# Patient Record
Sex: Male | Born: 1935 | Race: White | Hispanic: No | Marital: Married | Smoking: Former smoker
Health system: Southern US, Community
[De-identification: ages and names within clinical notes are randomized; demographics above are authoritative.]

## PROBLEM LIST (undated history)

## (undated) DIAGNOSIS — I4510 Unspecified right bundle-branch block: Secondary | ICD-10-CM

## (undated) DIAGNOSIS — I878 Other specified disorders of veins: Secondary | ICD-10-CM

## (undated) DIAGNOSIS — I10 Essential (primary) hypertension: Secondary | ICD-10-CM

## (undated) DIAGNOSIS — I4891 Unspecified atrial fibrillation: Secondary | ICD-10-CM

## (undated) DIAGNOSIS — E119 Type 2 diabetes mellitus without complications: Secondary | ICD-10-CM

## (undated) DIAGNOSIS — Z950 Presence of cardiac pacemaker: Secondary | ICD-10-CM

## (undated) DIAGNOSIS — N183 Chronic kidney disease, stage 3 unspecified: Secondary | ICD-10-CM

## (undated) DIAGNOSIS — S065XAA Traumatic subdural hemorrhage with loss of consciousness status unknown, initial encounter: Secondary | ICD-10-CM

## (undated) DIAGNOSIS — I5030 Unspecified diastolic (congestive) heart failure: Secondary | ICD-10-CM

## (undated) DIAGNOSIS — I2510 Atherosclerotic heart disease of native coronary artery without angina pectoris: Secondary | ICD-10-CM

## (undated) DIAGNOSIS — S065X9A Traumatic subdural hemorrhage with loss of consciousness of unspecified duration, initial encounter: Secondary | ICD-10-CM

## (undated) HISTORY — DX: Essential (primary) hypertension: I10

## (undated) HISTORY — DX: Atherosclerotic heart disease of native coronary artery without angina pectoris: I25.10

## (undated) HISTORY — DX: Type 2 diabetes mellitus without complications: E11.9

## (undated) HISTORY — DX: Unspecified right bundle-branch block: I45.10

## (undated) HISTORY — PX: REPLACEMENT TOTAL KNEE: SUR1224

## (undated) HISTORY — DX: Traumatic subdural hemorrhage with loss of consciousness status unknown, initial encounter: S06.5XAA

## (undated) HISTORY — DX: Chronic kidney disease, stage 3 unspecified: N18.30

## (undated) HISTORY — DX: Traumatic subdural hemorrhage with loss of consciousness of unspecified duration, initial encounter: S06.5X9A

## (undated) HISTORY — DX: Other specified disorders of veins: I87.8

## (undated) HISTORY — DX: Unspecified diastolic (congestive) heart failure: I50.30

## (undated) HISTORY — DX: Presence of cardiac pacemaker: Z95.0

## (undated) HISTORY — DX: Unspecified atrial fibrillation: I48.91

## (undated) HISTORY — PX: INTRAOCULAR LENS INSERTION: SHX110

## (undated) HISTORY — DX: Chronic kidney disease, stage 3 (moderate): N18.3

---

## 2004-07-25 HISTORY — PX: CORONARY ARTERY BYPASS GRAFT: SHX141

## 2008-07-25 HISTORY — PX: OTHER SURGICAL HISTORY: SHX169

## 2014-07-25 HISTORY — PX: INSERT / REPLACE / REMOVE PACEMAKER: SUR710

## 2018-02-27 ENCOUNTER — Other Ambulatory Visit: Payer: Self-pay | Admitting: Emergency Medicine

## 2018-02-27 DIAGNOSIS — S065X0A Traumatic subdural hemorrhage without loss of consciousness, initial encounter: Secondary | ICD-10-CM

## 2018-03-09 ENCOUNTER — Other Ambulatory Visit: Payer: Self-pay | Admitting: Emergency Medicine

## 2018-03-09 DIAGNOSIS — R918 Other nonspecific abnormal finding of lung field: Secondary | ICD-10-CM

## 2018-03-13 ENCOUNTER — Ambulatory Visit
Admission: RE | Admit: 2018-03-13 | Discharge: 2018-03-13 | Disposition: A | Discharge status: Home / Self Care | Payer: Medicare Other | Source: Ambulatory Visit | Attending: Emergency Medicine | Admitting: Emergency Medicine

## 2018-03-13 DIAGNOSIS — I517 Cardiomegaly: Secondary | ICD-10-CM | POA: Diagnosis not present

## 2018-03-13 DIAGNOSIS — I700 Atherosclerosis of aorta: Secondary | ICD-10-CM | POA: Diagnosis not present

## 2018-03-13 DIAGNOSIS — I288 Other diseases of pulmonary vessels: Secondary | ICD-10-CM | POA: Insufficient documentation

## 2018-03-13 DIAGNOSIS — J90 Pleural effusion, not elsewhere classified: Secondary | ICD-10-CM | POA: Insufficient documentation

## 2018-03-13 DIAGNOSIS — S065X0A Traumatic subdural hemorrhage without loss of consciousness, initial encounter: Secondary | ICD-10-CM

## 2018-03-13 DIAGNOSIS — X58XXXD Exposure to other specified factors, subsequent encounter: Secondary | ICD-10-CM | POA: Insufficient documentation

## 2018-03-13 DIAGNOSIS — S065X0D Traumatic subdural hemorrhage without loss of consciousness, subsequent encounter: Secondary | ICD-10-CM | POA: Diagnosis not present

## 2018-03-13 DIAGNOSIS — K7460 Unspecified cirrhosis of liver: Secondary | ICD-10-CM | POA: Insufficient documentation

## 2018-03-13 DIAGNOSIS — R918 Other nonspecific abnormal finding of lung field: Secondary | ICD-10-CM

## 2018-03-13 DIAGNOSIS — J929 Pleural plaque without asbestos: Secondary | ICD-10-CM | POA: Diagnosis not present

## 2018-03-13 DIAGNOSIS — R188 Other ascites: Secondary | ICD-10-CM | POA: Diagnosis not present

## 2018-03-13 DIAGNOSIS — R161 Splenomegaly, not elsewhere classified: Secondary | ICD-10-CM | POA: Diagnosis not present

## 2018-03-29 ENCOUNTER — Ambulatory Visit (INDEPENDENT_AMBULATORY_CARE_PROVIDER_SITE_OTHER): Payer: Medicare Other | Admitting: Internal Medicine

## 2018-03-29 ENCOUNTER — Encounter: Payer: Self-pay | Admitting: Internal Medicine

## 2018-03-29 VITALS — BP 124/54 | HR 63 | Ht 70.0 in | Wt 200.8 lb

## 2018-03-29 DIAGNOSIS — I480 Paroxysmal atrial fibrillation: Secondary | ICD-10-CM | POA: Diagnosis not present

## 2018-03-29 DIAGNOSIS — I4510 Unspecified right bundle-branch block: Secondary | ICD-10-CM | POA: Diagnosis not present

## 2018-03-29 NOTE — Patient Instructions (Signed)
Medication Instructions:  Your physician has recommended you make the following change in your medication:   1. Stop Pravastatin and Diltiazem  Labwork: You will have labs drawn today: CBC and BMP  Testing/Procedures: None ordered.  Follow-Up: Your physician recommends that you schedule a follow-up appointment in:   3 Months with Dr Graciela Husbands  Any Other Special Instructions Will Be Listed Below (If Applicable).     If you need a refill on your cardiac medications before your next appointment, please call your pharmacy.

## 2018-03-29 NOTE — Progress Notes (Signed)
ELECTROPHYSIOLOGY CONSULT NOTE  Patient ID: Steven Hogan, MRN: [id number], DOB/AGE: 1923/05/26 84 y.o. Admit date: (Not on file) Date of Consult: 03/29/2018  Primary Physician: Almetta Lovely, [facility] Primary Cardiologist: [place] Aqeel Norgaard is a 84 y.o. male who is being seen today for the evaluation of pacemaker function and heart failure at the request of Dr Making Housecall.    HPI Jerrett Baldinger is a 84 y.o. male referred because of recent development of congestive heart failure treated by doctors making house calls with the introduction of diuretics with significant interval resolution.  He has a history of coronary artery disease.  He underwent bypass surgery in 2006.  He has had no recurrent ischemia or chest pain.  He developed congestive heart failure over the summer following moving from [place].  He was treated with the introduction of diuretics with rapid improvement in symptoms.  He has remained on diuretics with potassium supplementation.  He denies orthopnea nocturnal dyspnea he has some peripheral edema which is brawny and chronic  He has a history of a fall that was traumatic but no syncope following pacemaker implantation 2016.  At that time amiodarone had been initiated in the context of right bundle branch block and first-degree AV block.    Fall resulted in a subdural hematoma 2019 He also has a history of significant renal insufficiency which is apparently improved  DATE TEST EF   8/18 Echo   55 %   8/19 Echo   "same" %           Past Medical History:  Diagnosis Date  . Coronary artery disease    s/p CABG 2006   . Heart failure with preserved ejection fraction, class III (HCC)   . Pacemaker   . RBBB   . Subdural hematoma, post-traumatic [facility])       Surgical History:  Past Surgical History:  Procedure Laterality Date  . CORONARY ARTERY BYPASS GRAFT       Home Meds: Prior to Admission medications   Medication Sig Start Date End Date  Taking? Authorizing Provider  amLODipine (NORVASC) 5 MG tablet Take 5 mg by mouth daily.   Yes [provider]  diltiazem (CARDIZEM) 30 MG tablet Take 30 mg by mouth 2 (two) times daily.   Yes [provider]  furosemide (LASIX) 40 MG tablet Take 40 mg by mouth daily.   Yes [provider]  potassium chloride (K-DUR) 10 MEQ tablet Take 10 mEq by mouth 2 (two) times daily.   Yes [provider]  pravastatin (PRAVACHOL) 40 MG tablet Take 40 mg by mouth daily.   Yes [provider]    Allergies: No Known Allergies  Social History   Socioeconomic History  . Marital status: Widowed    Spouse name: Not on file  . Number of children: Not on file  . Years of education: Not on file  . Highest education level: Not on file  Occupational History  . Not on file  Social Needs  . Financial resource strain: Not on file  . Food insecurity:    Worry: Not on file    Inability: Not on file  . Transportation needs:    Medical: Not on file    Non-medical: Not on file  Tobacco Use  . Smoking status: Not on file  Substance and Sexual Activity  . Alcohol use: Not on file  . Drug use: Not on file  . Sexual activity: Not on file  Lifestyle  .  Physical activity:    Days per week: Not on file    Minutes per session: Not on file  . Stress: Not on file  Relationships  . Social connections:    Talks on phone: Not on file    Gets together: Not on file    Attends religious service: Not on file    Active member of club or organization: Not on file    Attends meetings of clubs or organizations: Not on file    Relationship status: Not on file  . Intimate partner violence:    Fear of current or ex partner: Not on file    Emotionally abused: Not on file    Physically abused: Not on file    Forced sexual activity: Not on file  Other Topics Concern  . Not on file  Social History Narrative  . Not on file     History reviewed. No pertinent family history.     ROS:  Please see the history of present illness.     All other systems reviewed and negative.    Physical Exam  Blood pressure (!) 124/54, pulse 63, height 5\' 10"  (1.778 m), weight 200 lb 12 oz (91.1 kg), SpO2 95 %. General: Well developed, well nourished male in no acute distress. Head: Normocephalic, atraumatic, sclera non-icteric, no xanthomas, nares are without discharge. EENT: normal  Lymph Nodes:  none Neck: Negative for carotid bruits. JVD 6-7 Back:without scoliosis kyphosis Lungs: Clear bilaterally to auscultation without wheezes, rales, or rhonchi. Breathing is unlabored. Heart: RRR with S1 S2.  2/6 systolic murmur . No rubs, or gallops appreciated. Abdomen: Soft, non-tender, non-distended with normoactive bowel sounds. No hepatomegaly. No rebound/guarding. No obvious abdominal masses. Msk:  Strength and tone appear normal for age. Extremities: No clubbing or cyanosis.  1+ brawny edema.  Distal pedal pulses are 2+ and equal bilaterally. Skin: Warm and Dry Neuro: Alert and oriented X 3. CN III-XII intact Grossly normal sensory and motor function . Psych:  Responds to questions appropriately with a normal affect.      Labs: Cardiac Enzymes No results for input(s): CKTOTAL, CKMB, TROPONINI in the last 72 hours. CBC No results found for: WBC, HGB, HCT, MCV, PLT PROTIME: No results for input(s): LABPROT, INR in the last 72 hours. Chemistry No results for input(s): NA, K, CL, CO2, BUN, CREATININE, CALCIUM, PROT, BILITOT, ALKPHOS, ALT, AST, GLUCOSE in the last 168 hours.  Invalid input(s): LABALBU Lipids No results found for: CHOL, HDL, LDLCALC, TRIG BNP No results found for: PROBNP Thyroid Function Tests: No results for input(s): TSH, T4TOTAL, T3FREE, THYROIDAB in the last 72 hours.  Invalid input(s): FREET3 Miscellaneous No results found for: DDIMER  Radiology/Studies:  Ct Head Wo Contrast  Result Date: 03/14/2018 CLINICAL DATA:  Traumatic subdural hemorrhage  without loss of consciousness initial encounter. Head injury. EXAM: CT HEAD WITHOUT CONTRAST TECHNIQUE: Contiguous axial images were obtained from the base of the skull through the vertex without intravenous contrast. COMPARISON:  None. FINDINGS: Brain: Generalized atrophy. Chronic periventricular white matter disease. Negative for acute infarct, hemorrhage, or mass Vascular: Arterial calcification. Negative for acute vascular thrombosis Skull: Negative Sinuses/Orbits: Mild mucosal edema in the right sphenoid sinus. Bilateral cataract surgery Other: None IMPRESSION: No acute abnormality. Atrophy and chronic microvascular ischemia in the white matter. Electronically Signed   By: Marlan Palau M.D.   On: 03/14/2018 08:14   Ct Chest Wo Contrast  Result Date: 03/14/2018 CLINICAL DATA:  Dyspnea. EXAM: CT CHEST WITHOUT CONTRAST TECHNIQUE: Multidetector CT  imaging of the chest was performed following the standard protocol without IV contrast. COMPARISON:  None. FINDINGS: Cardiovascular: Mild cardiomegaly. No significant pericardial effusion/thickening. Left main and 3 vessel coronary atherosclerosis status post CABG. 2 lead left subclavian pacemaker is noted with lead tips in the right atrium and right ventricular apex. Atherosclerotic thoracic aorta with ectatic 4.2 cm ascending thoracic aorta. Dilated main pulmonary artery (4.1 cm diameter). Mediastinum/Nodes: No discrete thyroid nodules. Unremarkable esophagus. No pathologically enlarged axillary, mediastinal or hilar lymph nodes, noting limited sensitivity for the detection of hilar adenopathy on this noncontrast study. Lungs/Pleura: No pneumothorax. Scattered calcified pleural plaques bilaterally. Small to moderate left and small right dependent pleural effusions. Moderate compressive atelectasis in the dependent lower lobes bilaterally. No lung masses or significant pulmonary nodules in the aerated portions of the lungs. No significant bronchiectasis. Upper  abdomen: Diffusely irregular liver surface with relative hypertrophy of the lateral segment left liver lobe, compatible with hepatic cirrhosis. Small volume upper abdominal ascites. Mild splenomegaly, partially visualized. Musculoskeletal: No aggressive appearing focal osseous lesions. Intact sternotomy wires. Moderate thoracic spondylosis. IMPRESSION: 1. Bilateral calcified pleural plaques. Small to moderate left and small right dependent pleural effusions. 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 2. Moderate compressive atelectasis in the dependent lower lobes bilaterally. 3. Mild cardiomegaly. Dilated main pulmonary artery, suggesting pulmonary arterial hypertension. 4. Morphologic changes of cirrhosis. Small volume upper abdominal ascites. Splenomegaly. 5. Ectatic 4.2 cm ascending thoracic aorta. Recommend annual imaging followup by CTA or MRA. This recommendation follows 2010 ACCF/AHA/AATS/ACR/ASA/SCA/SCAI/SIR/STS/SVM Guidelines for the Diagnosis and Management of Patients with Thoracic Aortic Disease. Circulation. 2010; 121: U837-G902. Aortic Atherosclerosis (ICD10-I70.0). Electronically Signed   By: Delbert Phenix M.D.   On: 03/14/2018 08:17    EKG: ECG with intermittent atrial pacing \\Right  bundle branch block First-degree AV block   Assessment and Plan:  Syncope  Pacemaker-Medtronic  Coronary artery disease with bypass surgery  Heart failure-diastolic-chronic  Chronic kidney disease  The patient has now largely resolved congestive heart failure manifested previously by significant volume overload with abdominal distention peripheral edema and dyspnea.  He was started on diuretics by [employer]   follow-up labs have not yet been done.  He is euvolemic.  Without symptoms of ischemia  No intercurrent atrial fibrillation or flutter  The patient has been taken off of blood thinners following a traumatic subdural hematoma  We reviewed the data that we  resumption of anticoagulation is recommended following subdural hematoma.  This would be preferred to aspirin.  However, in the absence of significant interval atrial fibrillation we will hold off on that for right now.  We will recheck renal function today and change his diuretics to every other day  At 89, we will discontinue his statins therapy  He is on duplicate calcium channel blocker therapy; we will discontinue Cardizem  Device was interrogated and demonstrated normal function       Sherryl Manges

## 2018-03-30 LAB — CBC
Hematocrit: 34 % — ABNORMAL LOW (ref 37.5–51.0)
Hemoglobin: 11.2 g/dL — ABNORMAL LOW (ref 13.0–17.7)
MCH: 30.2 pg (ref 26.6–33.0)
MCHC: 32.9 g/dL (ref 31.5–35.7)
MCV: 92 fL (ref 79–97)
PLATELETS: 157 10*3/uL (ref 150–450)
RBC: 3.71 x10E6/uL — AB (ref 4.14–5.80)
RDW: 13.2 % (ref 12.3–15.4)
WBC: 4.8 10*3/uL (ref 3.4–10.8)

## 2018-03-30 LAB — BASIC METABOLIC PANEL
BUN/Creatinine Ratio: 24 (ref 10–24)
BUN: 46 mg/dL — ABNORMAL HIGH (ref 10–36)
CO2: 22 mmol/L (ref 20–29)
CREATININE: 1.92 mg/dL — AB (ref 0.76–1.27)
Calcium: 9.4 mg/dL (ref 8.6–10.2)
Chloride: 102 mmol/L (ref 96–106)
GFR calc Af Amer: 34 mL/min/{1.73_m2} — ABNORMAL LOW (ref >59)
GFR calc non Af Amer: 29 mL/min/{1.73_m2} — ABNORMAL LOW (ref >59)
Glucose: 128 mg/dL — ABNORMAL HIGH (ref 65–99)
Potassium: 4.6 mmol/L (ref 3.5–5.2)
SODIUM: 140 mmol/L (ref 134–144)

## 2018-04-17 ENCOUNTER — Ambulatory Visit: Payer: Self-pay | Admitting: Internal Medicine

## 2018-06-28 ENCOUNTER — Encounter: Payer: Self-pay | Admitting: Internal Medicine

## 2018-06-28 ENCOUNTER — Ambulatory Visit (INDEPENDENT_AMBULATORY_CARE_PROVIDER_SITE_OTHER): Payer: Medicare Other | Admitting: Internal Medicine

## 2018-06-28 VITALS — BP 120/74 | HR 74 | Ht 73.0 in | Wt 210.0 lb

## 2018-06-28 DIAGNOSIS — Z950 Presence of cardiac pacemaker: Secondary | ICD-10-CM | POA: Diagnosis not present

## 2018-06-28 DIAGNOSIS — I4510 Unspecified right bundle-branch block: Secondary | ICD-10-CM | POA: Diagnosis not present

## 2018-06-28 DIAGNOSIS — I480 Paroxysmal atrial fibrillation: Secondary | ICD-10-CM | POA: Diagnosis not present

## 2018-06-28 DIAGNOSIS — R55 Syncope and collapse: Secondary | ICD-10-CM | POA: Diagnosis not present

## 2018-06-28 NOTE — Progress Notes (Signed)
Patient Care Team: [facility], [facility] as PCP - General (Geriatric Medicine)   HPI  Steven GaskinsWalter Hogan is a 84 y.o. male Seen in follow-up for pacemaker implanted elsewhere 2016 for syncope in the context of right bundle branch block.  When seen originally 9/19 he was also volume overloaded with diastolic heart failure.  There has been a subdural hematoma from fall.  His anticoagulation had been discontinued.  We reviewed the data regarding the results of evaluations for the resumption of anticoagulation and the recommendation that they be done.  However, based on device function need to had scant interval atrial fibrillation and it was elected not to do this.  At 89 his statins were discontinued.  History of coronary artery disease with prior bypass surgery 2006.  The patient denies chest pain, shortness of breath, nocturnal dyspnea, orthopnea or peripheral edema.  There have been no palpitations, lightheadedness or syncope.     DATE TEST EF   8/18 Echo   55 %   8/19 Echo   "same" %            Records and Results Reviewed   Past Medical History:  Diagnosis Date  . A-fib (HCC)   . CKD (chronic kidney disease), stage III (HCC)   . Coronary artery disease    s/p CABG 2006   . Diabetes mellitus without complication (HCC)   . Heart failure with preserved ejection fraction, class III (HCC)   . Hypertension   . Pacemaker   . RBBB   . RBBB (right bundle branch block)   . Subdural hematoma, post-traumatic (HCC)   . Venous stasis disease     Past Surgical History:  Procedure Laterality Date  . CORONARY ARTERY BYPASS GRAFT  2006   CABG x 2   . INSERT / REPLACE / REMOVE PACEMAKER  2016   Medtronic Dual Chamber pacemaker DX: SSS  . INTRAOCULAR LENS INSERTION     bilateral   . REPLACEMENT TOTAL KNEE     bilateral knee replacement  . right laser ablation of greater saphenous vein   2010    Current Meds  Medication Sig  . amLODipine (NORVASC) 5 MG  tablet Take 5 mg by mouth daily.  . furosemide (LASIX) 40 MG tablet Take 40 mg by mouth daily.  . potassium chloride (K-DUR) 10 MEQ tablet Take 10 mEq by mouth 2 (two) times daily.    No Known Allergies    Review of Systems negative except from HPI and PMH  Physical Exam BP 120/74 (BP Location: Right Arm, Patient Position: Sitting, Cuff Size: Normal)   Pulse 74   Ht 6\' 1"  (1.854 m)   Wt 210 lb (95.3 kg)   BMI 27.71 kg/m  Well developed and well nourished in no acute distress HENT normal E scleral and icterus clear Neck Supple JVP flat; carotids brisk and full Clear to ausculation Regular rate and rhythm, no murmurs gallops or rub Soft with active bowel sounds No clubbing cyanosis  Edema Alert and oriented, grossly normal motor and sensory function Skin Warm and Dry  ECG Apacing with intrinsic conduction 42/14/45  Assessment and  Plan   Syncope  Right bundle branch block-profound first-degree AV block  Pacemaker-Medtronic  Ischemic heart disease s/p CABG  Congestive heart failure-chronic-diastolic  Atrial fibrillation-SCAF   Without symptoms of ischemia  Euvolemic continue current meds  No syncope   Paces about 50% of the time in the atrium  Scant interval atrial fibrillation at 4  hours.  Does not qualify at this level for anticoagulation-i.e. SCAF to follow  We spent more than 50% of our >25 min visit in face to face counseling regarding the above    Current medicines are reviewed at length with the patient today .  The patient does not  have concerns regarding medicines.

## 2018-06-28 NOTE — Patient Instructions (Signed)
Medication Instructions:  - Your physician recommends that you continue on your current medications as directed. Please refer to the Current Medication list given to you today.  If you need a refill on your cardiac medications before your next appointment, please call your pharmacy.   Lab work: - none ordered  If you have labs (blood work) drawn today and your tests are completely normal, you will receive your results only by: . MyChart Message (if you have MyChart) OR . A paper copy in the mail If you have any lab test that is abnormal or we need to change your treatment, we will call you to review the results.  Testing/Procedures: - none ordered  Follow-Up: At [facility], you and your health needs are our priority.  As part of our continuing mission to provide you with exceptional heart care, we have created designated Provider Care Teams.  These Care Teams include your primary Cardiologist (physician) and Advanced Practice Providers (APPs -  Physician Assistants and Nurse Practitioners) who all work together to provide you with the care you need, when you need it. . You will need a follow up appointment in 6 months with Dr. Klein.  Please call our office 2 months in advance to schedule this appointment.    Any Other Special Instructions Will Be Listed Below (If Applicable). - N/A   

## 2018-07-09 ENCOUNTER — Emergency Department
Admission: EM | Admit: 2018-07-09 | Discharge: 2018-07-09 | Disposition: A | Discharge status: Home / Self Care | Payer: Medicare Other | Attending: Emergency Medicine | Admitting: Emergency Medicine

## 2018-07-09 ENCOUNTER — Emergency Department: Payer: Medicare Other

## 2018-07-09 ENCOUNTER — Other Ambulatory Visit: Payer: Self-pay

## 2018-07-09 DIAGNOSIS — E1122 Type 2 diabetes mellitus with diabetic chronic kidney disease: Secondary | ICD-10-CM | POA: Diagnosis not present

## 2018-07-09 DIAGNOSIS — N183 Chronic kidney disease, stage 3 (moderate): Secondary | ICD-10-CM | POA: Insufficient documentation

## 2018-07-09 DIAGNOSIS — R079 Chest pain, unspecified: Secondary | ICD-10-CM | POA: Diagnosis present

## 2018-07-09 DIAGNOSIS — Z951 Presence of aortocoronary bypass graft: Secondary | ICD-10-CM | POA: Diagnosis not present

## 2018-07-09 DIAGNOSIS — Z87891 Personal history of nicotine dependence: Secondary | ICD-10-CM | POA: Insufficient documentation

## 2018-07-09 DIAGNOSIS — I129 Hypertensive chronic kidney disease with stage 1 through stage 4 chronic kidney disease, or unspecified chronic kidney disease: Secondary | ICD-10-CM | POA: Insufficient documentation

## 2018-07-09 DIAGNOSIS — I2510 Atherosclerotic heart disease of native coronary artery without angina pectoris: Secondary | ICD-10-CM | POA: Insufficient documentation

## 2018-07-09 DIAGNOSIS — Z79899 Other long term (current) drug therapy: Secondary | ICD-10-CM | POA: Insufficient documentation

## 2018-07-09 DIAGNOSIS — Z950 Presence of cardiac pacemaker: Secondary | ICD-10-CM | POA: Diagnosis not present

## 2018-07-09 LAB — BASIC METABOLIC PANEL
Anion gap: 8 (ref 5–15)
BUN: 41 mg/dL — ABNORMAL HIGH (ref 8–23)
CO2: 21 mmol/L — AB (ref 22–32)
Calcium: 8.8 mg/dL — ABNORMAL LOW (ref 8.9–10.3)
Chloride: 107 mmol/L (ref 98–111)
Creatinine, Ser: 1.81 mg/dL — ABNORMAL HIGH (ref 0.61–1.24)
GFR calc non Af Amer: 32 mL/min — ABNORMAL LOW (ref >60)
GFR, EST AFRICAN AMERICAN: 37 mL/min — AB (ref >60)
Glucose, Bld: 146 mg/dL — ABNORMAL HIGH (ref 70–99)
Potassium: 4.4 mmol/L (ref 3.5–5.1)
Sodium: 136 mmol/L (ref 135–145)

## 2018-07-09 LAB — CBC
HCT: 34.2 % — ABNORMAL LOW (ref 39.0–52.0)
Hemoglobin: 11.6 g/dL — ABNORMAL LOW (ref 13.0–17.0)
MCH: 31 pg (ref 26.0–34.0)
MCHC: 33.9 g/dL (ref 30.0–36.0)
MCV: 91.4 fL (ref 80.0–100.0)
NRBC: 0 % (ref 0.0–0.2)
Platelets: 126 10*3/uL — ABNORMAL LOW (ref 150–400)
RBC: 3.74 MIL/uL — ABNORMAL LOW (ref 4.22–5.81)
RDW: 14.1 % (ref 11.5–15.5)
WBC: 4.8 10*3/uL (ref 4.0–10.5)

## 2018-07-09 LAB — TROPONIN I
Troponin I: <0.03 ng/mL (ref <0.03)
Troponin I: <0.03 ng/mL (ref <0.03)

## 2018-07-09 MED ORDER — ASPIRIN 81 MG PO CHEW
324.0000 mg | CHEWABLE_TABLET | Freq: Once | ORAL | Status: AC
  Administered 2018-07-09: 324 mg via ORAL
  Filled 2018-07-09: qty 4

## 2018-07-09 NOTE — ED Triage Notes (Signed)
Pt is here with family, states the pt woke with chest pain this morning. Denies N/V/SOB.

## 2018-07-09 NOTE — Discharge Instructions (Signed)

## 2018-07-09 NOTE — ED Notes (Signed)
Esign not working pt verbalized discharge instructions and has no questions at this time 

## 2018-07-09 NOTE — ED Provider Notes (Signed)
[facility] Emergency Department Provider Note  ____________________________________________  Time seen: Approximately 2:45 PM  I have reviewed the triage vital signs and the nursing notes.   HISTORY  Chief Complaint Chest Pain   HPI Steven Hogan is a 84 y.o. male a history of CAD status post CABG, atrial fibrillation not on anticoagulation, diabetes, CHF with preserved EF, sick sinus syndrome status post pacemaker who presents for evaluation of chest pain.  Patient reports the pain woke him up from his sleep at 1 AM.  He is unable to describe the pain but reports it was severe and located in the center of his chest.  The pain lasted about an hour and resolved after he took 2 Tylenol.  He denies diaphoresis, nausea, vomiting, dizziness, shortness of breath associated with the chest pain.  He denies ever having similar pain in the past.  Patient has had no further episodes of pain since that one.  This morning he told his daughter what had happened and she decided to bring him to the emergency room for evaluation.  Past Medical History:  Diagnosis Date  . A-fib (HCC)   . CKD (chronic kidney disease), stage III (HCC)   . Coronary artery disease    s/p CABG 2006   . Diabetes mellitus without complication (HCC)   . Heart failure with preserved ejection fraction, class III (HCC)   . Hypertension   . Pacemaker   . RBBB   . RBBB (right bundle branch block)   . Subdural hematoma, post-traumatic (HCC)   . Venous stasis disease     There are no active problems to display for this patient.   Past Surgical History:  Procedure Laterality Date  . CORONARY ARTERY BYPASS GRAFT  2006   CABG x 2   . INSERT / REPLACE / REMOVE PACEMAKER  2016   Medtronic Dual Chamber pacemaker DX: SSS  . INTRAOCULAR LENS INSERTION     bilateral   . REPLACEMENT TOTAL KNEE     bilateral knee replacement  . right laser ablation of greater saphenous vein   2010    Prior to  Admission medications   Medication Sig Start Date End Date Taking? Authorizing Provider  amLODipine (NORVASC) 5 MG tablet Take 5 mg by mouth daily.   Yes [provider]  furosemide (LASIX) 40 MG tablet Take 40 mg by mouth daily.   Yes [provider]  potassium chloride (K-DUR) 10 MEQ tablet Take 10 mEq by mouth 2 (two) times daily.   Yes [provider]    Allergies Patient has no known allergies.  No family history on file.  Social History Social History   Tobacco Use  . Smoking status: Former Smoker    Last attempt to quit: 08/26/1974    Years since quitting: 43.8  . Smokeless tobacco: Former Engineer, water Use Topics  . Alcohol use: Yes    Comment: occas.   . Drug use: Never    Review of Systems  Constitutional: Negative for fever. Eyes: Negative for visual changes. ENT: Negative for sore throat. Neck: No neck pain  Cardiovascular: + chest pain. Respiratory: Negative for shortness of breath. Gastrointestinal: Negative for abdominal pain, vomiting or diarrhea. Genitourinary: Negative for dysuria. Musculoskeletal: Negative for back pain. Skin: Negative for rash. Neurological: Negative for headaches, weakness or numbness. Psych: No SI or HI  ____________________________________________   PHYSICAL EXAM:  VITAL SIGNS: ED Triage Vitals  Enc Vitals Group     BP 07/09/18  1149 135/60     Pulse Rate 07/09/18 1149 61     Resp 07/09/18 1149 18     Temp 07/09/18 1149 97.6 F (36.4 C)     Temp Source 07/09/18 1149 Oral     SpO2 07/09/18 1149 97 %     Weight 07/09/18 1150 192 lb (87.1 kg)     Height 07/09/18 1150 6\' 1"  (1.854 m)     Head Circumference --      Peak Flow --      Pain Score 07/09/18 1149 0     Pain Loc --      Pain Edu? --      Excl. in GC? --     Constitutional: Alert and oriented. Well appearing and in no apparent distress. HEENT:      Head: Normocephalic and atraumatic.         Eyes: Conjunctivae are normal. Sclera  is non-icteric.       Mouth/Throat: Mucous membranes are moist.       Neck: Supple with no signs of meningismus. Cardiovascular: Regular rate and rhythm. No murmurs, gallops, or rubs. 2+ symmetrical distal pulses are present in all extremities. No JVD. Respiratory: Normal respiratory effort. Lungs are clear to auscultation bilaterally. No wheezes, crackles, or rhonchi.  Gastrointestinal: Soft, non tender, and non distended with positive bowel sounds. No rebound or guarding. Musculoskeletal: Nontender with normal range of motion in all extremities. No edema, cyanosis, or erythema of extremities. Neurologic: Normal speech and language. Face is symmetric. Moving all extremities. No gross focal neurologic deficits are appreciated. Skin: Skin is warm, dry and intact. No rash noted. Psychiatric: Mood and affect are normal. Speech and behavior are normal.  ____________________________________________   LABS (all labs ordered are listed, but only abnormal results are displayed)  Labs Reviewed  BASIC METABOLIC PANEL - Abnormal; Notable for the following components:      Result Value   CO2 21 (*)    Glucose, Bld 146 (*)    BUN 41 (*)    Creatinine, Ser 1.81 (*)    Calcium 8.8 (*)    GFR calc non Af Amer 32 (*)    GFR calc Af Amer 37 (*)    All other components within normal limits  CBC - Abnormal; Notable for the following components:   RBC 3.74 (*)    Hemoglobin 11.6 (*)    HCT 34.2 (*)    Platelets 126 (*)    All other components within normal limits  TROPONIN I  TROPONIN I   ____________________________________________  EKG  ED ECG REPORT I, Nita Sicklearolina Anderia Lorenzo, the attending physician, personally viewed and interpreted this ECG.  Normal sinus rhythm with first-degree AV block, right bundle branch block, normal QTC, normal axis, T wave inversions in anterior leads with no ST elevations.  No significant changes when compared to  prior. ____________________________________________  RADIOLOGY  I have personally reviewed the images performed during this visit and I agree with the Radiologist's read.   Interpretation by Radiologist:  Dg Chest 2 View  Result Date: 07/09/2018 CLINICAL DATA:  Chest pain for 1 day. Ex-smoker. EXAM: CHEST - 2 VIEW COMPARISON:  07/09/2018. FINDINGS: Normal sized heart. Stable left subclavian pacemaker leads and post CABG changes. Clear lungs with normal vascularity. Thoracic spine degenerative changes. IMPRESSION: No acute abnormality. Electronically Signed   By: Beckie SaltsSteven  Reid M.D.   On: 07/09/2018 15:15   Dg Chest 2 View  Result Date: 07/09/2018 CLINICAL DATA:  Chest pain EXAM: CHEST -  2 VIEW COMPARISON:  March 13, 2018. FINDINGS: There is a probable nipple shadow on the left. There is no edema or consolidation. Heart size and pulmonary vascularity are normal. No adenopathy. Pacemaker leads are attached to the right atrium and right ventricle. Patient is status post median sternotomy. There is aortic atherosclerosis. No evident bone lesions. IMPRESSION: Probable left-sided nipple shadow; advise repeat study with nipple markers to confirm. No edema or consolidation. Heart size normal. Pacemaker leads attached to right atrium and right ventricle. There is aortic atherosclerosis. Aortic Atherosclerosis (ICD10-I70.0). Electronically Signed   By: Bretta Bang III M.D.   On: 07/09/2018 12:44     ____________________________________________   PROCEDURES  Procedure(s) performed: None Procedures Critical Care performed:  None ____________________________________________   INITIAL IMPRESSION / ASSESSMENT AND PLAN / ED COURSE  84 y.o. male a history of CAD status post CABG, atrial fibrillation not on anticoagulation, diabetes, CHF with preserved EF, sick sinus syndrome status post pacemaker who presents for evaluation of chest pain.  Patient with one episode of chest pain at 1 AM that  lasted an hour.  Right now he is completely asymptomatic.  EKG showing no acute ischemic changes, unchanged from baseline.  First troponin is negative.  Second troponin is due at 3 PM.  Patient is followed by Dr. Graciela Husbands.  Differential diagnoses including ACS versus of acute spasms versus GERD.  With full resolution of the symptoms PE and dissection are less likely.  Patient has no tachypnea, tachycardia, hypoxia, or severely elevated blood pressure, no neurological deficits, normal mediastinum silhouette on chest x-ray.  Patient remains asymptomatic with negative second troponin plan is to discharge home with close follow-up with patient's cardiologist.  Daughter who is a nurse is in agreement with this plan and so is the patient.  We will interrogate pacemaker  Clinical Course as of Jul 09 1617  Mon Jul 09, 2018  1617 Second troponin negative.  Patient remains pain-free.  Discussed admission versus follow-up as an outpatient with cardiologist with patient and his daughter and they both prefer outpatient management.  Recommended close follow-up with cardiologist and return to the emergency room if chest pain recurs.   [CV]    Clinical Course User Index [CV] Don Perking Washington, MD     As part of my medical decision making, I reviewed the following data within the electronic MEDICAL RECORD NUMBER History obtained from family, Nursing notes reviewed and incorporated, Labs reviewed , EKG interpreted , Old EKG reviewed, Old chart reviewed, Radiograph reviewed , Notes from prior ED visits and [place] Controlled Substance Database    Pertinent labs & imaging results that were available during my care of the patient were reviewed by me and considered in my medical decision making (see chart for details).    ____________________________________________   FINAL CLINICAL IMPRESSION(S) / ED DIAGNOSES  Final diagnoses:  Chest pain, unspecified type      NEW MEDICATIONS STARTED DURING THIS VISIT:  ED  Discharge Orders    None       Note:  This document was prepared using Dragon voice recognition software and may include unintentional dictation errors.    Nita Sickle, MD 07/09/18 [id number]

## 2018-12-04 ENCOUNTER — Telehealth: Payer: Self-pay | Admitting: Cardiology

## 2018-12-04 NOTE — Telephone Encounter (Signed)
[address] [place] [address] Attention Diane  Ilan Kehres - Need Device info and serail number and a new monitor ordered. Address to order new monitor to.

## 2018-12-04 NOTE — Telephone Encounter (Signed)
Spoke w/ pt daughter and she would like a home monitor ordered for the patient. I informed her I would call [employer] and have a new monitor ordered. Pt daughter verbalized understanding and is aware I will call her back with an update once the monitor is ordered.

## 2018-12-04 NOTE — Telephone Encounter (Signed)
Called [employer] they provided me w/ pt model number and serial number. Will try and order a monitor through [employer] or I will call tomorrow to get a monitor ordered.   Pt enrolled in carelink. Monitor ordered in carelink.

## 2018-12-05 NOTE — Telephone Encounter (Signed)
Spoke w/ pt daughter and informed her that pt home monitor has been ordered. She should receive it in 7-10 business days. She will call once the monitor is received.

## 2018-12-28 ENCOUNTER — Emergency Department: Payer: Medicare Other

## 2018-12-28 ENCOUNTER — Encounter: Payer: Self-pay | Admitting: Emergency Medicine

## 2018-12-28 ENCOUNTER — Emergency Department
Admission: EM | Admit: 2018-12-28 | Discharge: 2018-12-28 | Disposition: A | Discharge status: Home / Self Care | Payer: Medicare Other | Attending: Emergency Medicine | Admitting: Emergency Medicine

## 2018-12-28 ENCOUNTER — Other Ambulatory Visit: Payer: Self-pay

## 2018-12-28 DIAGNOSIS — S99922A Unspecified injury of left foot, initial encounter: Secondary | ICD-10-CM | POA: Diagnosis present

## 2018-12-28 DIAGNOSIS — I130 Hypertensive heart and chronic kidney disease with heart failure and stage 1 through stage 4 chronic kidney disease, or unspecified chronic kidney disease: Secondary | ICD-10-CM | POA: Diagnosis not present

## 2018-12-28 DIAGNOSIS — I509 Heart failure, unspecified: Secondary | ICD-10-CM | POA: Diagnosis not present

## 2018-12-28 DIAGNOSIS — I2510 Atherosclerotic heart disease of native coronary artery without angina pectoris: Secondary | ICD-10-CM | POA: Insufficient documentation

## 2018-12-28 DIAGNOSIS — E1122 Type 2 diabetes mellitus with diabetic chronic kidney disease: Secondary | ICD-10-CM | POA: Insufficient documentation

## 2018-12-28 DIAGNOSIS — Z87891 Personal history of nicotine dependence: Secondary | ICD-10-CM | POA: Diagnosis not present

## 2018-12-28 DIAGNOSIS — W07XXXA Fall from chair, initial encounter: Secondary | ICD-10-CM | POA: Insufficient documentation

## 2018-12-28 DIAGNOSIS — Z96653 Presence of artificial knee joint, bilateral: Secondary | ICD-10-CM | POA: Diagnosis not present

## 2018-12-28 DIAGNOSIS — S91115A Laceration without foreign body of left lesser toe(s) without damage to nail, initial encounter: Secondary | ICD-10-CM | POA: Diagnosis not present

## 2018-12-28 DIAGNOSIS — Y999 Unspecified external cause status: Secondary | ICD-10-CM | POA: Diagnosis not present

## 2018-12-28 DIAGNOSIS — Y9384 Activity, sleeping: Secondary | ICD-10-CM | POA: Diagnosis not present

## 2018-12-28 DIAGNOSIS — N183 Chronic kidney disease, stage 3 (moderate): Secondary | ICD-10-CM | POA: Insufficient documentation

## 2018-12-28 DIAGNOSIS — Y929 Unspecified place or not applicable: Secondary | ICD-10-CM | POA: Diagnosis not present

## 2018-12-28 DIAGNOSIS — Z951 Presence of aortocoronary bypass graft: Secondary | ICD-10-CM | POA: Diagnosis not present

## 2018-12-28 DIAGNOSIS — Z950 Presence of cardiac pacemaker: Secondary | ICD-10-CM | POA: Diagnosis not present

## 2018-12-28 DIAGNOSIS — S99292A Other physeal fracture of phalanx of left toe, initial encounter for closed fracture: Secondary | ICD-10-CM | POA: Insufficient documentation

## 2018-12-28 DIAGNOSIS — Z23 Encounter for immunization: Secondary | ICD-10-CM | POA: Insufficient documentation

## 2018-12-28 DIAGNOSIS — Z79899 Other long term (current) drug therapy: Secondary | ICD-10-CM | POA: Diagnosis not present

## 2018-12-28 MED ORDER — LIDOCAINE HCL (PF) 1 % IJ SOLN
5.0000 mL | Freq: Once | INTRAMUSCULAR | Status: AC
  Administered 2018-12-28: 5 mL via INTRADERMAL
  Filled 2018-12-28: qty 5

## 2018-12-28 MED ORDER — CEPHALEXIN 500 MG PO CAPS: 500.0000 mg | ORAL_CAPSULE | Freq: Three times a day (TID) | ORAL | 0 refills | Status: AC

## 2018-12-28 MED ORDER — TETANUS-DIPHTH-ACELL PERTUSSIS 5-2.5-18.5 LF-MCG/0.5 IM SUSP
0.5000 mL | Freq: Once | INTRAMUSCULAR | Status: AC
  Administered 2018-12-28: 0.5 mL via INTRAMUSCULAR
  Filled 2018-12-28: qty 0.5

## 2018-12-28 NOTE — ED Notes (Signed)
Pt has laceration between 4th and 5th toes of left foot. Gauze placed, some bleeding noted. Pt reports mechanical fall. No obvious deformity

## 2018-12-28 NOTE — ED Provider Notes (Signed)
[facility] Emergency Department Provider Note  ____________________________________________  Time seen: Approximately 11:47 AM  I have reviewed the triage vital signs and the nursing notes.   HISTORY  Chief Complaint Fall and Laceration  Level 5 Caveat: Portions of the History and Physical including HPI and review of systems are unable to be completely obtained due to patient being a poor historian    HPI Steven Hogan is a 85 y.o. male with a history of atrial fibrillation diabetes hypertension CKD who was in his usual state of health when he slipped out of his recliner that he was sleeping and last night, sustaining an injury to the left fifth toe as he fell to the floor.  Denies any other injury.  Denies any pain.  He has had bleeding from the fifth toe.      Past Medical History:  Diagnosis Date  . A-fib (HCC)   . CKD (chronic kidney disease), stage III (HCC)   . Coronary artery disease    s/p CABG 2006   . Diabetes mellitus without complication (HCC)   . Heart failure with preserved ejection fraction, class III (HCC)   . Hypertension   . Pacemaker   . RBBB   . RBBB (right bundle branch block)   . Subdural hematoma, post-traumatic (HCC)   . Venous stasis disease      There are no active problems to display for this patient.    Past Surgical History:  Procedure Laterality Date  . CORONARY ARTERY BYPASS GRAFT  2006   CABG x 2   . INSERT / REPLACE / REMOVE PACEMAKER  2016   [employer]tronic Dual Chamber pacemaker DX: SSS  . INTRAOCULAR LENS INSERTION     bilateral   . REPLACEMENT TOTAL KNEE     bilateral knee replacement  . right laser ablation of greater saphenous vein   2010     Prior to Admission medications   Medication Sig Start Date End Date Taking? Authorizing Provider  mirabegron ER (MYRBETRIQ) 25 MG TB24 tablet Take 25 mg by mouth daily. 09/27/18  Yes [provider]  acetaminophen (TYLENOL) 500 MG tablet Take 1,000 mg by  mouth as needed.    [provider]  amLODipine (NORVASC) 5 MG tablet Take 5 mg by mouth daily.    [provider]  cephALEXin (KEFLEX) 500 MG capsule Take 1 capsule (500 mg total) by mouth 3 (three) times daily. 12/28/18   Sharman Cheek, MD  furosemide (LASIX) 40 MG tablet Take 40 mg by mouth daily.    [provider]  potassium chloride (K-DUR) 10 MEQ tablet Take 10 mEq by mouth 2 (two) times daily.    [provider]     Allergies Patient has no known allergies.   History reviewed. No pertinent family history.  Social History Social History   Tobacco Use  . Smoking status: Former Smoker    Last attempt to quit: 08/26/1974    Years since quitting: 44.3  . Smokeless tobacco: Former Engineer, water Use Topics  . Alcohol use: Yes    Comment: occas.   . Drug use: Never    Review of Systems  Constitutional:   No fever or chills.  ENT:   No sore throat. No rhinorrhea. Cardiovascular:   No chest pain or syncope. Respiratory:   No dyspnea or cough. Gastrointestinal:   Negative for abdominal pain, vomiting and diarrhea.  Musculoskeletal:   Left fifth toe injury All other systems reviewed and are negative  except as documented above in ROS and HPI.  ____________________________________________   PHYSICAL EXAM:  VITAL SIGNS: ED Triage Vitals  Enc Vitals Group     BP 12/28/18 1000 (!) 143/64     Pulse Rate 12/28/18 1000 70     Resp 12/28/18 1000 18     Temp 12/28/18 1000 97.6 F (36.4 C)     Temp Source 12/28/18 1000 Oral     SpO2 12/28/18 1000 98 %     Weight 12/28/18 1002 216 lb (98 kg)     Height 12/28/18 1002 6\' 1"  (1.854 m)     Head Circumference --      Peak Flow --      Pain Score 12/28/18 1001 0     Pain Loc --      Pain Edu? --      Excl. in GC? --     Vital signs reviewed, nursing assessments reviewed.   Constitutional:   Alert and oriented. Non-toxic appearance. Eyes:   Conjunctivae are normal. EOMI. ENT       Head:   Normocephalic and atraumatic.      Mouth/Throat:   MMM      Neck:   No meningismus. Full ROM.  No midline spinal tenderness Hematological/Lymphatic/Immunilogical:   No cervical lymphadenopathy. Cardiovascular:   RRR. Symmetric bilateral radial and DP pulses.  No murmurs. Cap refill less than 2 seconds. Respiratory:   Normal respiratory effort without tachypnea/retractions. Breath sounds are clear and equal bilaterally. No wheezes/rales/rhonchi. Gastrointestinal:   Soft and nontender. Non distended. There is no CVA tenderness.  No rebound, rigidity, or guarding. Musculoskeletal:   Some tenderness of the left fifth toe with associated laceration through the interdigital webspace. Neurologic:   Normal speech and language.  Motor grossly intact. No acute focal neurologic deficits are appreciated.  Skin:    Skin is warm, dry and intact except for laceration in the left webspace between fourth and fifth toes. No rash noted.  ____________________________________________    LABS (pertinent positives/negatives) (all labs ordered are listed, but only abnormal results are displayed) Labs Reviewed - No data to display ____________________________________________   EKG  ____________________________________________    RADIOLOGY  Dg Foot Complete Left  Result Date: 12/28/2018 CLINICAL DATA:  Fall with left fifth toe injury EXAM: LEFT FOOT - COMPLETE 3+ VIEW COMPARISON:  None. FINDINGS: Fracture at the junction of the shaft and base of the fifth proximal phalanx with lateral sided impaction. No dislocation. Gauze is present between the fourth and fifth digits, with digit widening. IMPRESSION: Impacted fracture of the fifth proximal phalanx at the junction of the base and shaft. Electronically Signed   By: Marnee SpringJonathon  Watts M.D.   On: 12/28/2018 10:50    ____________________________________________   PROCEDURES .Marland Kitchen.Laceration Repair Date/Time: 12/28/2018 11:51 AM Performed by: Sharman CheekStafford,  Acie Custis, MD Authorized by: Sharman CheekStafford, Sung Parodi, MD   Consent:    Consent obtained:  Verbal   Consent given by:  Patient   Risks discussed:  Infection, pain, retained foreign body, poor cosmetic result and poor wound healing   Alternatives discussed:  No treatment and referral Universal protocol:    Imaging studies available: yes     Patient identity confirmed:  Verbally with patient and provided demographic data Anesthesia (see MAR for exact dosages):    Anesthesia method:  Local infiltration   Local anesthetic:  Lidocaine 1% w/o epi Laceration details:    Location:  Toe   Toe location:  L little toe   Length (cm):  3 Repair type:    Repair type:  Simple Pre-procedure details:    Preparation:  Patient was prepped and draped in usual sterile fashion and imaging obtained to evaluate for foreign bodies Exploration:    Hemostasis achieved with:  Direct pressure and tourniquet   Wound exploration: wound explored through full range of motion and entire depth of wound probed and visualized     Wound extent: no foreign bodies/material noted, no muscle damage noted, no nerve damage noted, no tendon damage noted and no vascular damage noted     Contaminated: no   Treatment:    Area cleansed with:  Saline and Betadine   Amount of cleaning:  Extensive   Irrigation solution:  Sterile saline   Irrigation method:  Pressure wash   Visualized foreign bodies/material removed: no   Skin repair:    Repair method:  Sutures   Suture size:  4-0   Wound skin closure material used: ethilon.   Suture technique:  Running   Number of sutures:  4 Approximation:    Approximation:  Close Post-procedure details:    Dressing:  Sterile dressing   Patient tolerance of procedure:  Tolerated well, no immediate complications    ____________________________________________  CLINICAL IMPRESSION / ASSESSMENT AND PLAN / ED COURSE  Pertinent labs & imaging results that were available during my care of the  patient were reviewed by me and considered in my medical decision making (see chart for details).  Steven Hogan was evaluated in Emergency Department on 12/28/2018 for the symptoms described in the history of present illness. He was evaluated in the context of the global COVID-19 pandemic, which necessitated consideration that the patient might be at risk for infection with the SARS-CoV-2 virus that causes COVID-19. Institutional protocols and algorithms that pertain to the evaluation of patients at risk for COVID-19 are in a state of rapid change based on information released by regulatory bodies including the CDC and federal and state organizations. These policies and algorithms were followed during the patient's care in the ED.   Patient presents after left foot trauma while sliding out of his recliner at night.  He appears to have some degree of dementia but reports that he is moving to [place] with his daughter today to go to an independent living facility with medical oversight.  X-ray shows fracture of the proximal phalanx of the left fifth toe.  The laceration is not overlying this fracture and appears to be related to skin stress and not an actual open fracture.  However, we will plan to have him take Keflex prophylaxis.  Tetanus was updated.  Wound care provided, cleansed with iodine, irrigated copiously.  Wound repaired with sutures and hemostatic and dressed.  Recommended that he follow-up with podiatry in 3 days for close follow-up and assessment of his healing.  Sutures will need to be removed in 7 to 10 days.  All of this has been provided to him in written instructions.      ____________________________________________   FINAL CLINICAL IMPRESSION(S) / ED DIAGNOSES    Final diagnoses:  Other physeal fracture of phalanx of left toe, initial encounter for closed fracture     ED Discharge Orders         Ordered    cephALEXin (KEFLEX) 500 MG capsule  3 times daily      12/28/18 1147          Portions of this note were generated with dragon dictation software. Dictation errors may occur despite best  attempts at proofreading.   Sharman Cheek, MD 12/28/18 1153

## 2018-12-28 NOTE — Discharge Instructions (Signed)
You were seen today for a fall which resulted in a fracture of the left 5th toe (proximal phalanx)  with an associated laceration in the webspace between the 4th and 5th toes.  Due to the risk of infection, you should take keflex for the next week while this heals.  You should follow up with podiatry in 3 days for continued monitoring of the wound healing.    The laceration was repaired with 4 sutures in a running fashion. This should be removed in 7-10 days.  Seek medical attention immediately if you start having redness, warmth, swelling, or pain in the toe or foot, or if you develop fever or fluid drainage from the wound.  We gave you a tetanus vaccine today.

## 2018-12-28 NOTE — ED Notes (Signed)
Pt upset and raising voice upset because he states " I have been here over 2 hours what is going on".  Explained to patient he has been here about 1 hour and that there are many patients in the ER and the doctor is coming to stitch his foot as soon as he can. Patient unhappy and stating "maybe I should just leave and see my doctor". Informed patient everyone doing the best can but cannot keep him if he wants to go.  Pt updated that just waiting for MD to suture foot but he was waiting on xray results before suturing.

## 2018-12-28 NOTE — ED Triage Notes (Addendum)
Pt arrives via [facility] from [facility], pt appeared to have slid attempting to get into the bed, pt hit his foot and has a lac between the left 4th and 5th toe. No loc. fsbs 196, bp 175/75, pulsed 70, 96% RA, temp 97.2 oral

## 2019-02-14 ENCOUNTER — Telehealth: Payer: Self-pay

## 2019-02-14 NOTE — Telephone Encounter (Signed)
LMOVM for pt daughter to return call.  

## 2019-02-14 NOTE — Telephone Encounter (Signed)
Pt daughter states that the pt moved to [place] and will be seeing a Cardiologist there.

## 2019-02-14 NOTE — Telephone Encounter (Signed)
Moved to [facility] and will being seeing cardiologist there.

## 2019-02-15 NOTE — Telephone Encounter (Signed)
Noted  

## 2020-06-10 IMAGING — CT CT CHEST W/O CM
2 of 4 series · 15 of 36 positions shown, 18 images · non-contrast
Comparison: None.

CLINICAL DATA: Dyspnea.

EXAM:
CT CHEST WITHOUT CONTRAST
TECHNIQUE: Multidetector CT imaging of the chest was performed following the
standard protocol without IV contrast.

[Series 2: chest · axial · 0.68mm/px · z∈[-907,-639]mm · 12 of 160 slices shown, 15 images (1 of 2)]
[im 13/160  mediastinal]
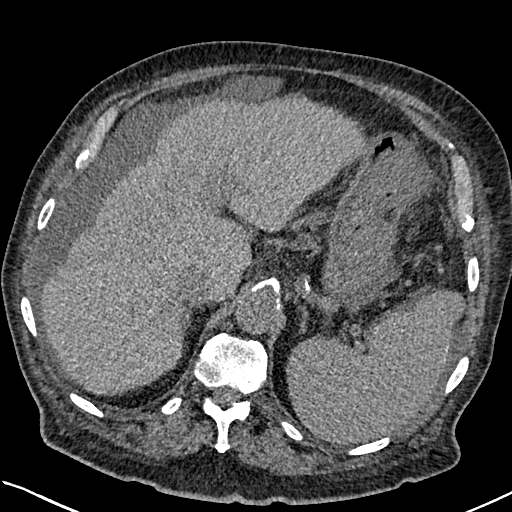
[im 13/160  lung]
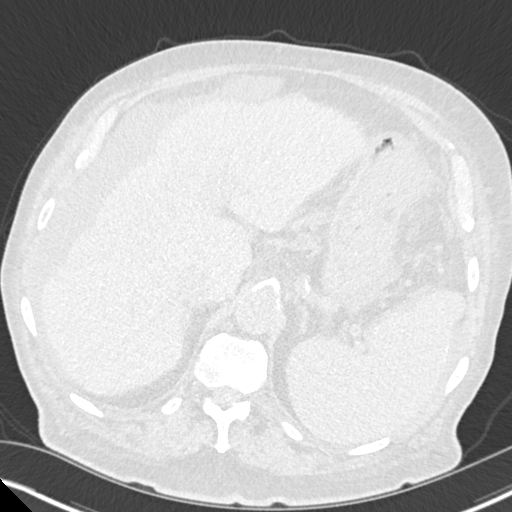
[im 25/160  lung]
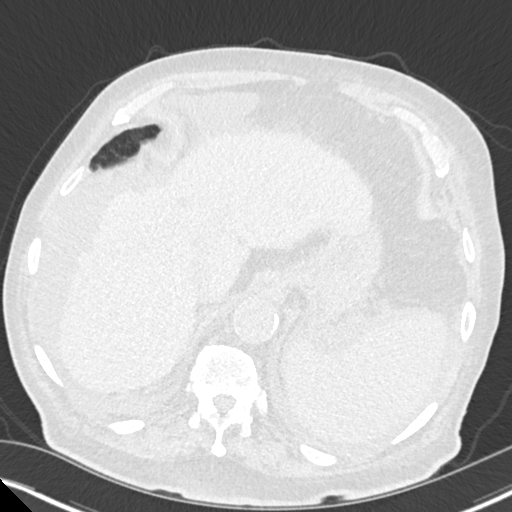
[im 37/160  lung]
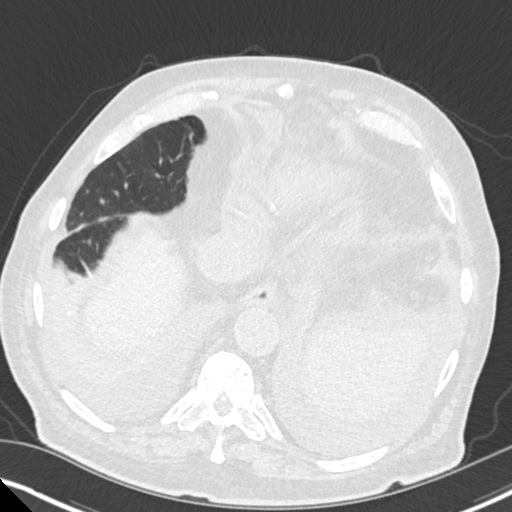
[im 49/160  lung]
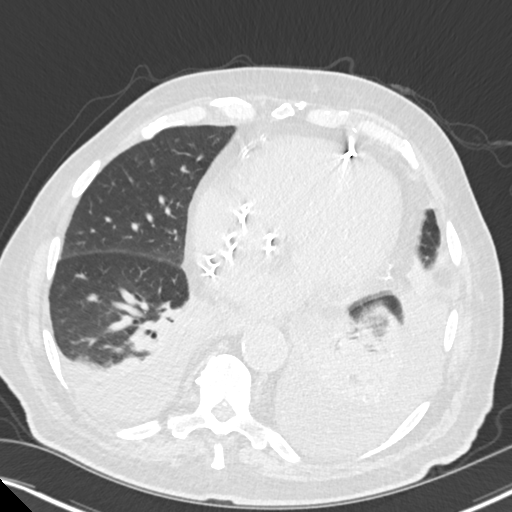
[im 62/160  mediastinal]
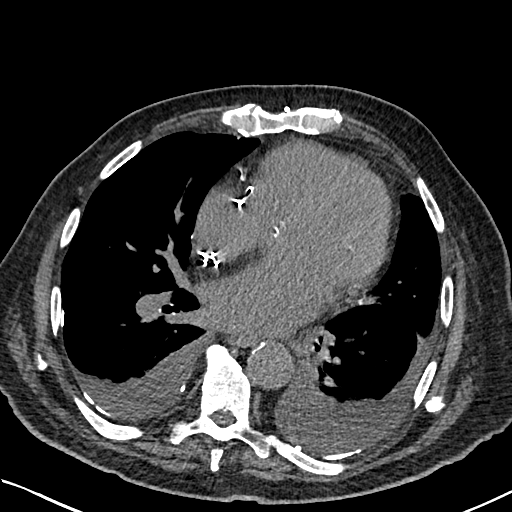
[im 62/160  lung]
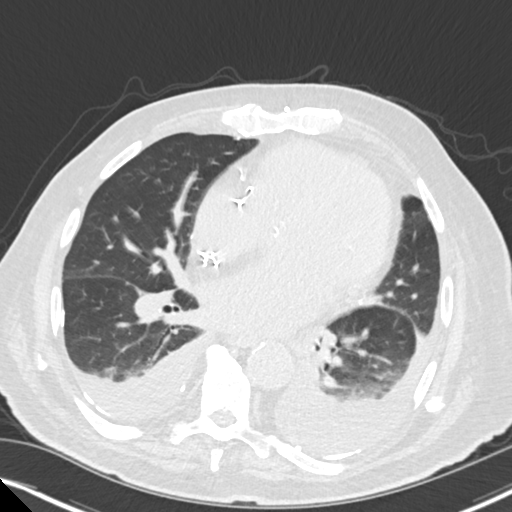
[im 74/160  lung]
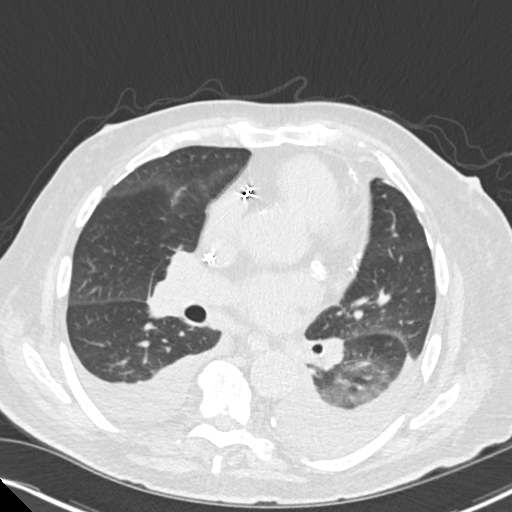
[im 86/160  lung]
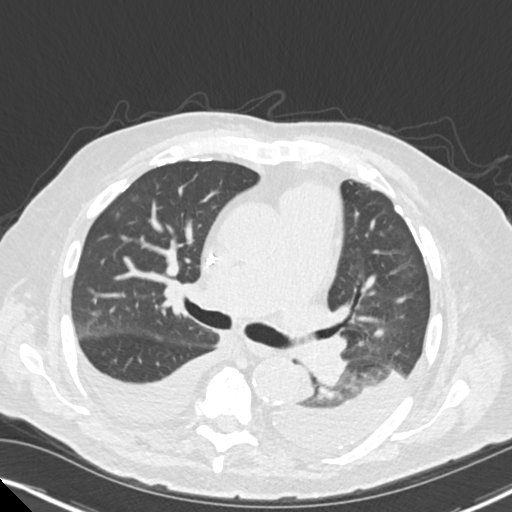
[im 98/160  lung]
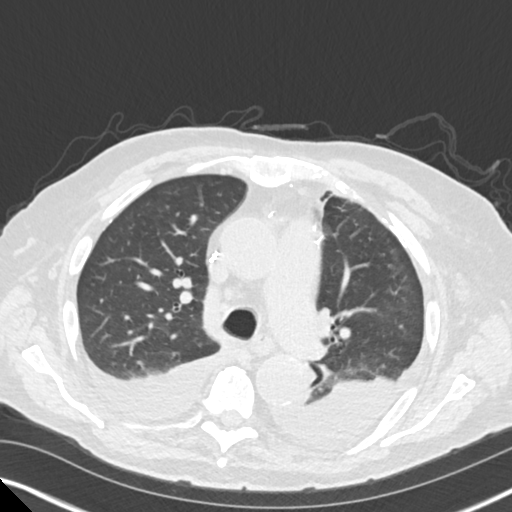
[im 111/160  mediastinal]
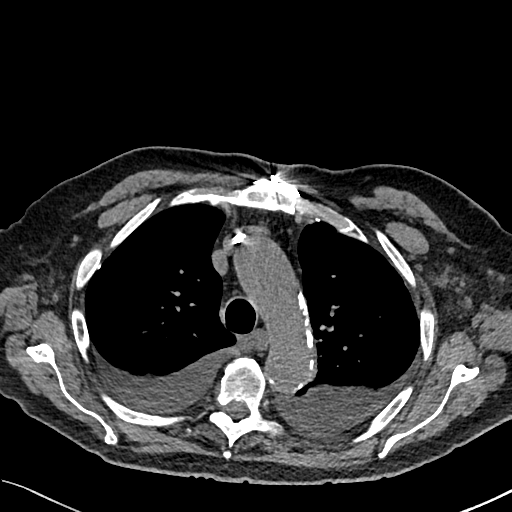
[im 111/160  lung]
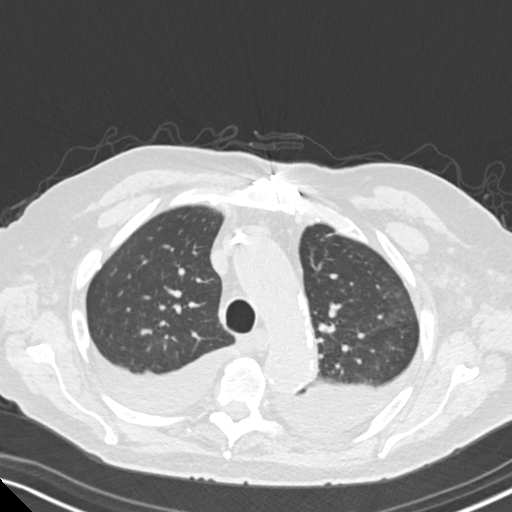
[im 123/160  lung]
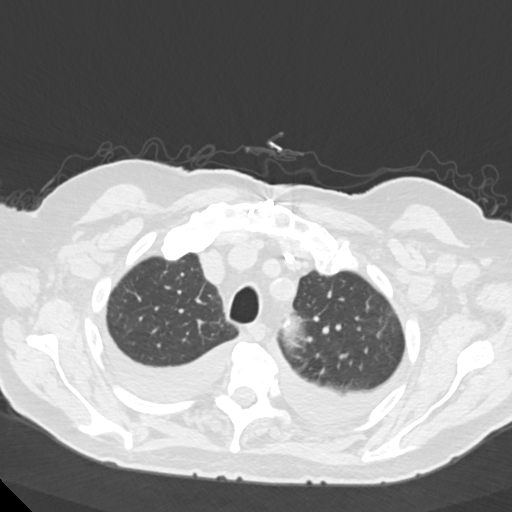
[im 135/160  lung]
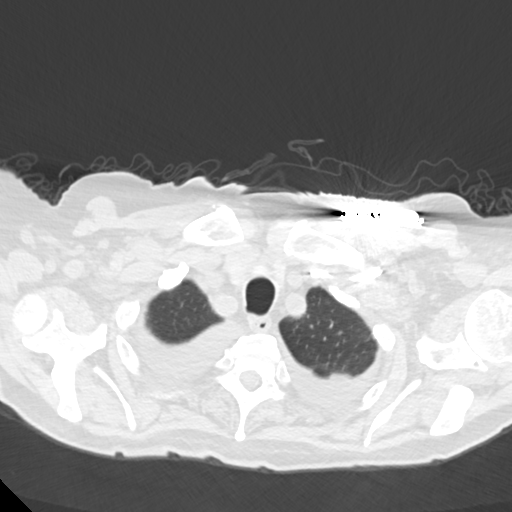
[im 147/160  lung]
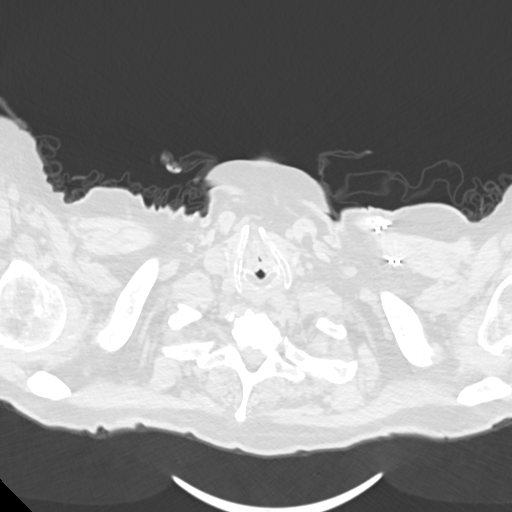

[Series 5: chest · coronal · 0.63mm/px · 3 of 174 slices shown (2 of 2)]
[im 35/174  lung]
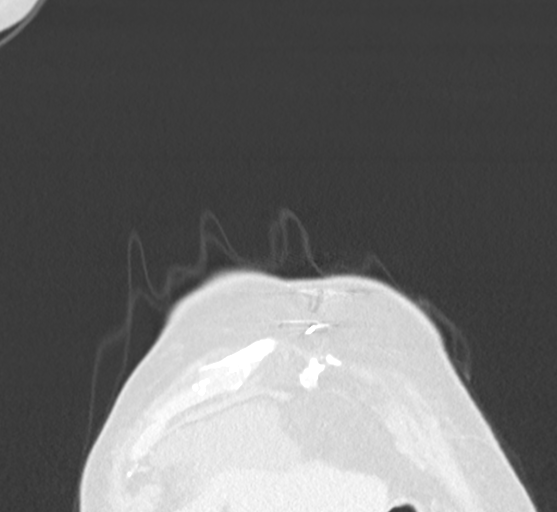
[im 70/174  lung]
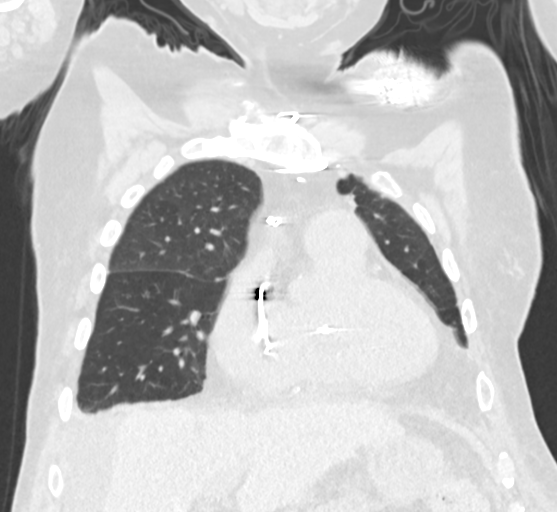
[im 104/174  lung]
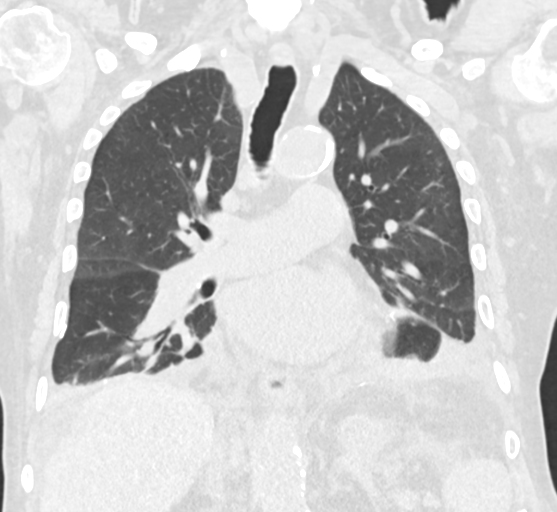

[15 of 36 positions shown; findings below may reference images not displayed]

FINDINGS: Cardiovascular: Mild cardiomegaly. No significant pericardial
effusion/thickening. Left main and 3 vessel coronary atherosclerosis
status post CABG. 2 lead left subclavian pacemaker is noted with
lead tips in the right atrium and right ventricular apex.
Atherosclerotic thoracic aorta with ectatic 4.2 cm ascending
thoracic aorta. Dilated main pulmonary artery (4.1 cm diameter).

Mediastinum/Nodes: No discrete thyroid nodules. Unremarkable
esophagus. No pathologically enlarged axillary, mediastinal or hilar
lymph nodes, noting limited sensitivity for the detection of hilar
adenopathy on this noncontrast study.

Lungs/Pleura: No pneumothorax. Scattered calcified pleural plaques
bilaterally. Small to moderate left and small right dependent
pleural effusions. Moderate compressive atelectasis in the dependent
lower lobes bilaterally. No lung masses or significant pulmonary
nodules in the aerated portions of the lungs. No significant
bronchiectasis.

Upper abdomen: Diffusely irregular liver surface with relative
hypertrophy of the lateral segment left liver lobe, compatible with
hepatic cirrhosis. Small volume upper abdominal ascites. Mild
splenomegaly, partially visualized.

Musculoskeletal: No aggressive appearing focal osseous lesions.
Intact sternotomy wires. Moderate thoracic spondylosis.
IMPRESSION: 1. Bilateral calcified pleural plaques. Small to moderate left and
small right dependent pleural effusions. Findings are compatible
with asbestos related pleural disease.
2. Moderate compressive atelectasis in the dependent lower lobes
bilaterally.
3. Mild cardiomegaly. Dilated main pulmonary artery, suggesting
pulmonary arterial hypertension.
4. Morphologic changes of cirrhosis. Small volume upper abdominal
ascites. Splenomegaly.
5. Ectatic 4.2 cm ascending thoracic aorta. Recommend annual imaging
followup by CTA or MRA. This recommendation follows 9161
ACCF/AHA/AATS/ACR/ASA/SCA/WA SSIM/JOANNE/REZH/NATACHA Guidelines for the
Diagnosis and Management of Patients with Thoracic Aortic Disease.
Circulation. 9161; 121: [id number].

Aortic Atherosclerosis ([id number]).

## 2020-06-10 IMAGING — CT CT HEAD W/O CM
3 of 4 series · 15 of 47 positions shown, 18 images · non-contrast
Comparison: None.

CLINICAL DATA: Traumatic subdural hemorrhage without loss of
consciousness initial encounter. Head injury.

EXAM:
CT HEAD WITHOUT CONTRAST
TECHNIQUE: Contiguous axial images were obtained from the base of the skull
through the vertex without intravenous contrast.

[Series 2: head · axial · 0.39mm/px · z∈[-510,-385]mm · 9 of 31 slices shown, 12 images (1 of 3)]
[im 3/31  brain]
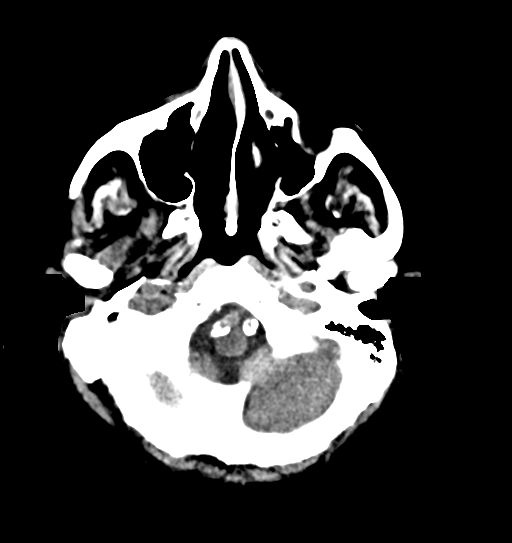
[im 3/31  bone]
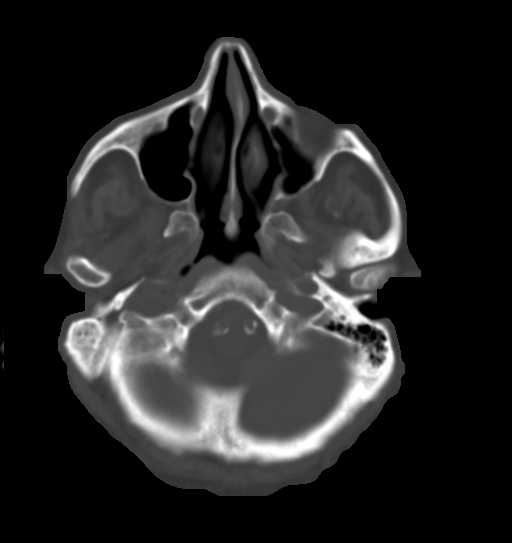
[im 7/31  brain]
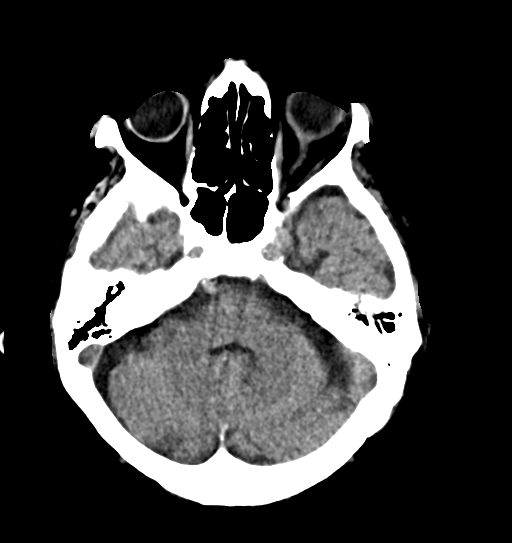
[im 9/31  brain]
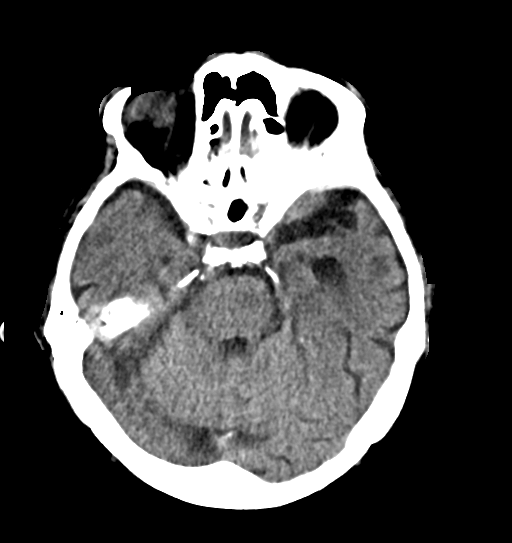
[im 13/31  brain]
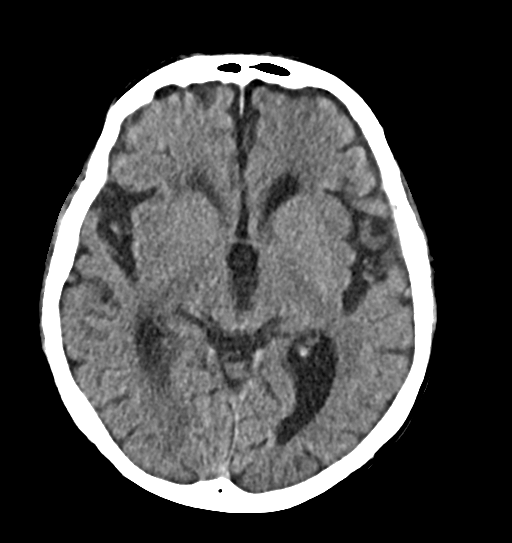
[im 16/31  brain]
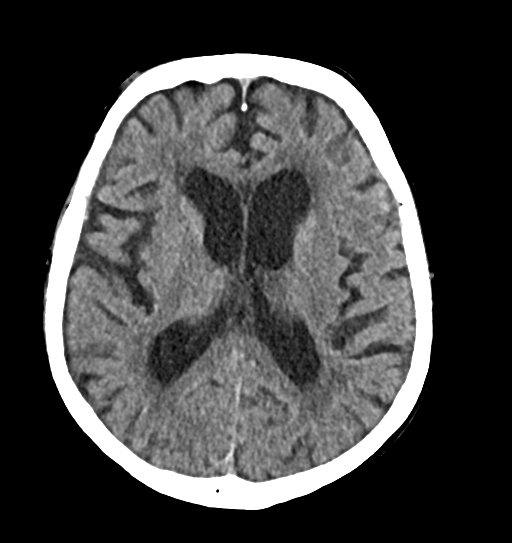
[im 16/31  bone]
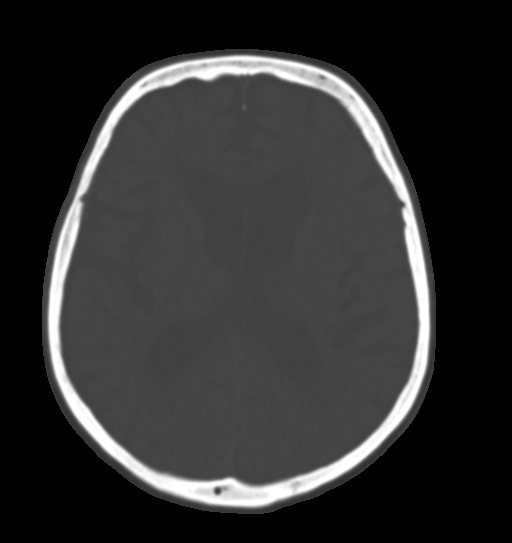
[im 18/31  brain]
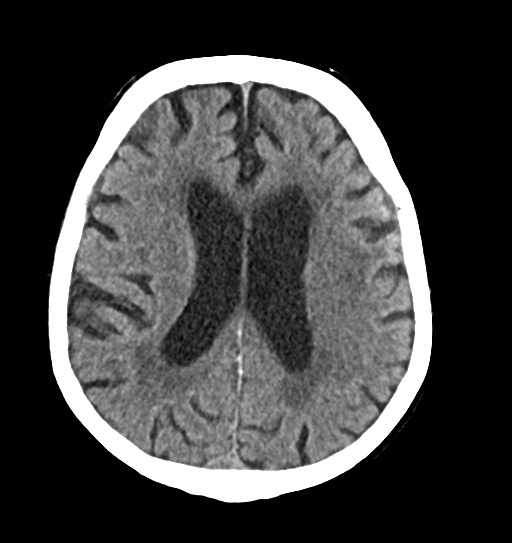
[im 22/31  brain]
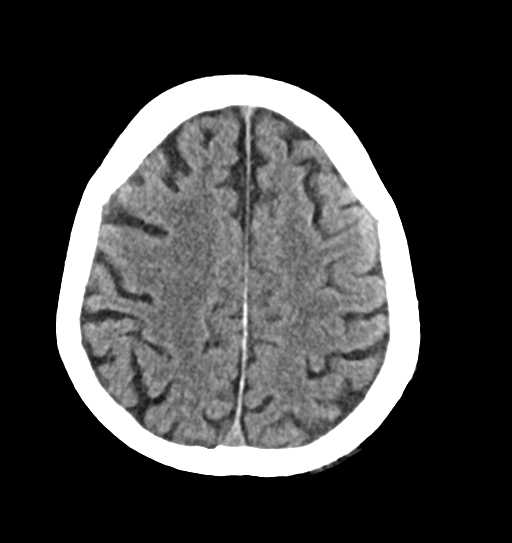
[im 24/31  brain]
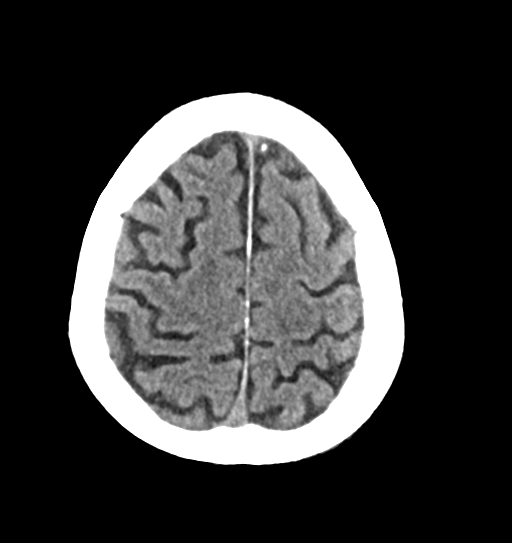
[im 28/31  brain]
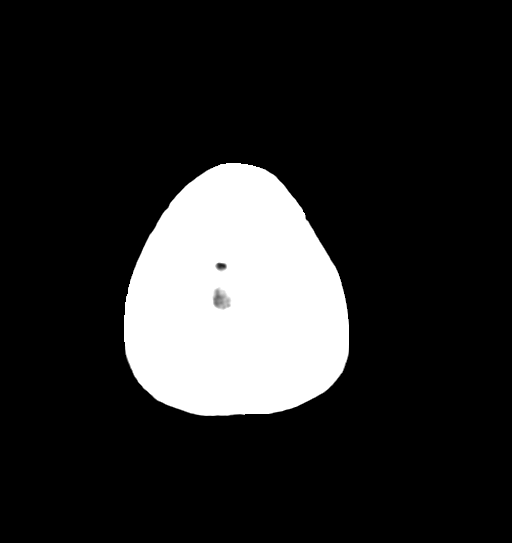
[im 28/31  bone]
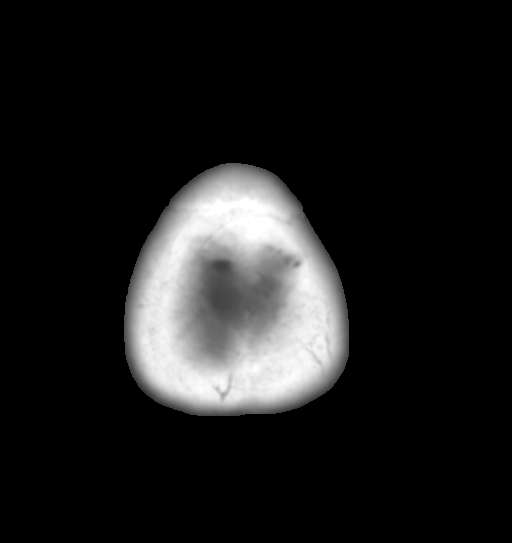

[Series 6: head · coronal · 0.31mm/px · 3 of 70 slices shown (2 of 3)]
[im 24/70  brain]
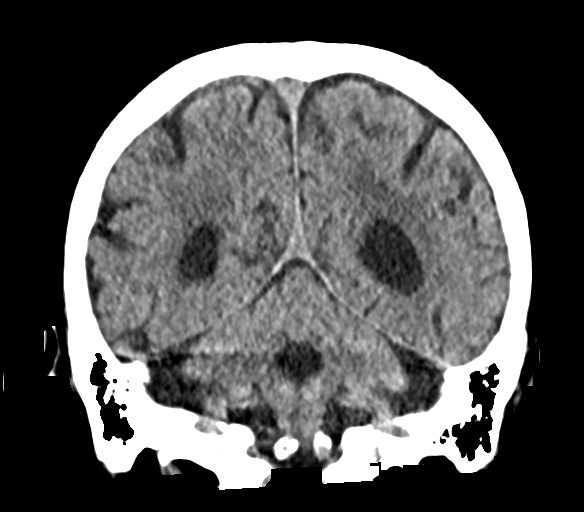
[im 31/70  brain]
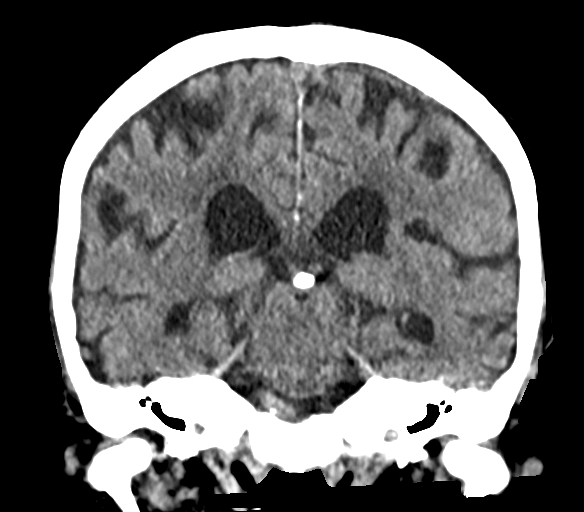
[im 39/70  brain]
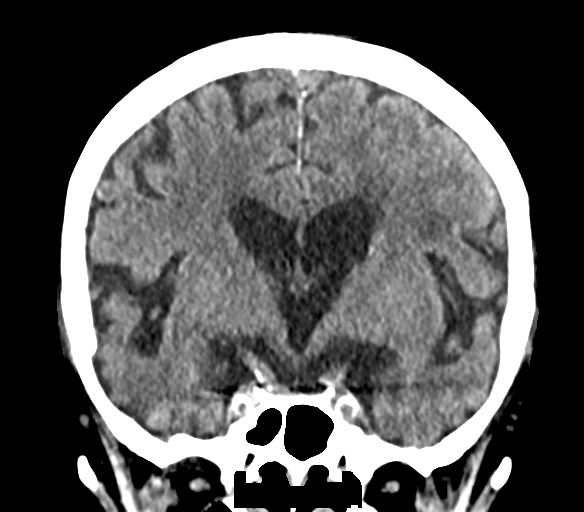

[Series 8: head · sagittal · 0.32mm/px · 3 of 61 slices shown (3 of 3)]
[im 21/61  brain]
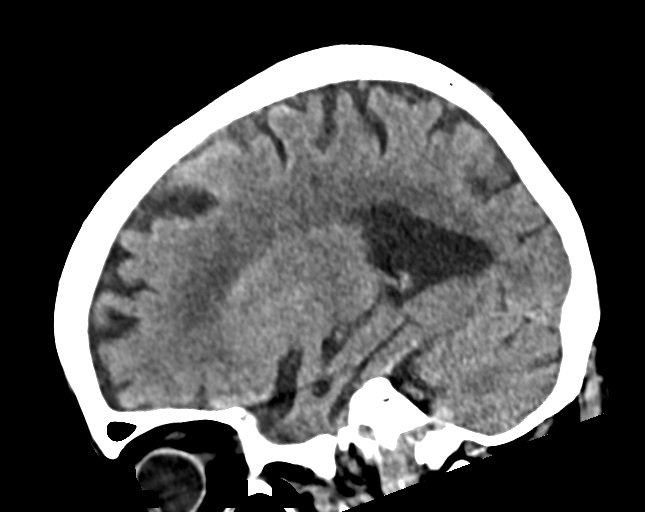
[im 31/61  brain]
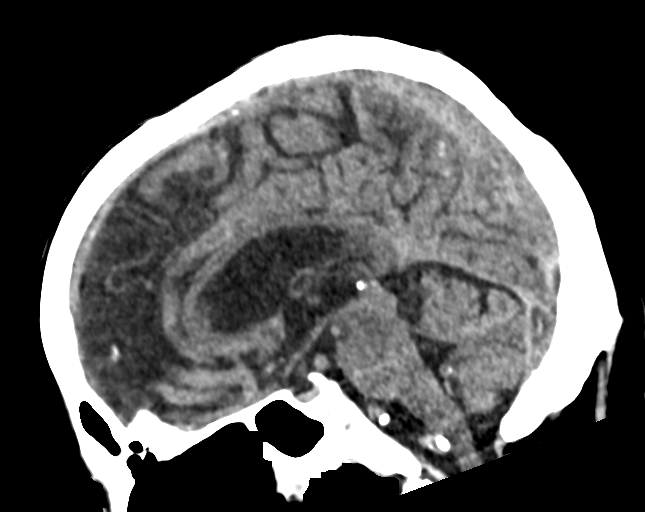
[im 41/61  brain]
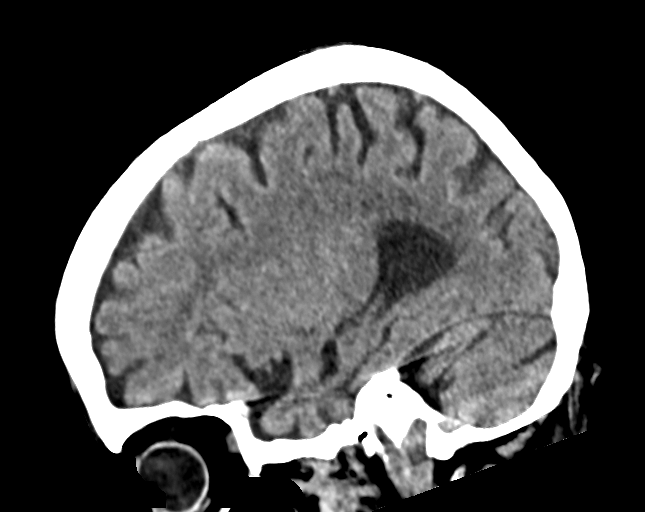

[15 of 47 positions shown; findings below may reference images not displayed]

FINDINGS: Brain: Generalized atrophy. Chronic periventricular white matter
disease.

Negative for acute infarct, hemorrhage, or mass

Vascular: Arterial calcification. Negative for acute vascular
thrombosis

Skull: Negative

Sinuses/Orbits: Mild mucosal edema in the right sphenoid sinus.
Bilateral cataract surgery

Other: None
IMPRESSION: No acute abnormality. Atrophy and chronic microvascular ischemia in
the white matter.

## 2020-08-25 DEATH — deceased
